# Patient Record
Sex: Male | Born: 1995 | Race: Black or African American | Hispanic: No | Marital: Single
Health system: Southern US, Community
[De-identification: ages and names within clinical notes are randomized; demographics above are authoritative.]

---

## 2019-05-06 ENCOUNTER — Emergency Department (HOSPITAL_COMMUNITY): Payer: Medicaid - Out of State

## 2019-05-06 ENCOUNTER — Emergency Department (HOSPITAL_COMMUNITY)
Admission: EM | Admit: 2019-05-06 | Discharge: 2019-05-06 | Disposition: A | Discharge status: Home / Self Care | Payer: Medicaid - Out of State | Attending: Emergency Medicine | Admitting: Emergency Medicine

## 2019-05-06 DIAGNOSIS — Y9302 Activity, running: Secondary | ICD-10-CM | POA: Diagnosis not present

## 2019-05-06 DIAGNOSIS — X500XXA Overexertion from strenuous movement or load, initial encounter: Secondary | ICD-10-CM | POA: Diagnosis not present

## 2019-05-06 DIAGNOSIS — Z113 Encounter for screening for infections with a predominantly sexual mode of transmission: Secondary | ICD-10-CM | POA: Insufficient documentation

## 2019-05-06 DIAGNOSIS — S99912A Unspecified injury of left ankle, initial encounter: Secondary | ICD-10-CM | POA: Diagnosis present

## 2019-05-06 DIAGNOSIS — S93402A Sprain of unspecified ligament of left ankle, initial encounter: Secondary | ICD-10-CM

## 2019-05-06 DIAGNOSIS — Y999 Unspecified external cause status: Secondary | ICD-10-CM | POA: Insufficient documentation

## 2019-05-06 DIAGNOSIS — N5089 Other specified disorders of the male genital organs: Secondary | ICD-10-CM | POA: Insufficient documentation

## 2019-05-06 DIAGNOSIS — Y9289 Other specified places as the place of occurrence of the external cause: Secondary | ICD-10-CM | POA: Insufficient documentation

## 2019-05-06 LAB — HIV ANTIBODY (ROUTINE TESTING W REFLEX): HIV Screen 4th Generation wRfx: NONREACTIVE

## 2019-05-06 NOTE — Discharge Instructions (Signed)
Please read and follow all provided instructions.  Your diagnoses today include:  1. Sprain of left ankle, unspecified ligament, initial encounter   2. Routine screening for STI (sexually transmitted infection)     Tests performed today include:  An x-ray of your ankle - does NOT show any broken bones  Vital signs. See below for your results today.   Medications prescribed:  Please use over-the-counter NSAID medications (ibuprofen, naproxen) as directed on the packaging for pain.   Take any prescribed medications only as directed.  Home care instructions:   Follow any educational materials contained in this packet  Follow R.I.C.E. Protocol:  R - rest your injury   I  - use ice on injury without applying directly to skin  C - compress injury with bandage or splint  E - elevate the injury as much as possible  Follow-up instructions: Please follow-up with your primary care provider or the provided orthopedic (bone specialist) if you continue to have significant pain or trouble walking in 1 week. In this case you may have a severe sprain that requires further care.   Return instructions:   Please return if your toes are numb or tingling, appear gray or blue, or you have severe pain (also elevate leg and loosen splint or wrap)  Please return to the Emergency Department if you experience worsening symptoms.   Please return if you have any other emergent concerns. -------------- Your caregiver has diagnosed you as suffering from an ankle sprain. Ankle sprain occurs when the ligaments that hold the ankle joint together are stretched or torn. It may take 4 to 6 weeks to heal.  For Activity: If prescribed crutches, use crutches with non-weight bearing for the first few days. Then, you may walk on your ankle as the pain allows, or as instructed. Start gradually with weight bearing on the affected ankle. Once you can walk pain free, then try jogging. When you can run forwards, then  you can try moving side-to-side. If you cannot walk without crutches in one week, you need a re-check. --------------  Other tests performed today include:  Test for gonorrhea and chlamydia.   Test for HIV and syphilis.   You will be notified by telephone with any positive results.   Vital signs. See below for your results today.   Home care instructions:  Read educational materials contained in this packet and follow any instructions provided.   You should tell your partners about your infection and avoid having sex for one week to allow time for the medicine to work.  Sexually transmitted disease testing also available at:   [facility], [facility]  [address] [place], phone [phone] or [phone]    Monday - Friday, call for an appointment  Return instructions:   Please return to the Emergency Department if you experience worsening symptoms.   Please return if you have any other emergent concerns.  Additional Information:  Your vital signs today were: BP 106/73 (BP Location: Left Arm)   Pulse (!) 120   Temp 98.3 F (36.8 C) (Oral)   Resp 16   SpO2 100%  If your blood pressure (BP) was elevated above 135/85 this visit, please have this repeated by your doctor within one month. --------------

## 2019-05-06 NOTE — ED Provider Notes (Signed)
[facility] EMERGENCY DEPARTMENT Provider Note   CSN: [id number] Arrival date & time: 05/06/19  1245     History Chief Complaint  Patient presents with  . Ankle Pain    Grant Cisneros is a 24 y.o. male.  Patient presents to the emergency department this morning with 2 problems.  First he twisted his left ankle while running down a ramp yesterday while playing laser tag.  He has had pain to the lateral aspect of his ankle and difficulty bearing weight since that time.  He is not able to bend his foot as much.  Pain is worse with weightbearing.  Patient placed a wrap and Velcro splint prior to arrival.  In addition, patient this morning noted a small lesion to the shaft of the penis.  This was painless.  No dysuria, urethral discharge, increased frequency or urgency.  No known contacts however patient is sexually active.  He was concerned about syphilis.  No history of herpes simplex.  No treatments prior to arrival.        No past medical history on file.  There are no problems to display for this patient.   The histories are not reviewed yet. Please review them in the "History" navigator section and refresh this [place].     No family history on file.  Social History   Tobacco Use  . Smoking status: Not on file  Substance Use Topics  . Alcohol use: Not on file  . Drug use: Not on file    Home Medications Prior to Admission medications   Not on File    Allergies    Patient has no known allergies.  Review of Systems   Review of Systems  Constitutional: Negative for activity change and fever.  HENT: Negative for sore throat.   Eyes: Negative for discharge.  Gastrointestinal: Negative for rectal pain.  Genitourinary: Positive for genital sores. Negative for discharge, dysuria, frequency, penile pain and testicular pain.  Musculoskeletal: Positive for arthralgias, gait problem and myalgias. Negative for back pain, joint swelling and neck  pain.  Skin: Negative for rash and wound.  Neurological: Negative for weakness and numbness.  Hematological: Negative for adenopathy.    Physical Exam Updated Vital Signs BP 106/73 (BP Location: Left Arm)   Pulse (!) 120   Temp 98.3 F (36.8 C) (Oral)   Resp 16   SpO2 100%   Physical Exam Vitals and nursing note reviewed.  Constitutional:      Appearance: He is well-developed.  HENT:     Head: Normocephalic and atraumatic.  Eyes:     Conjunctiva/sclera: Conjunctivae normal.  Cardiovascular:     Pulses:          Dorsalis pedis pulses are 2+ on the right side and 2+ on the left side.       Posterior tibial pulses are 2+ on the right side and 2+ on the left side.  Pulmonary:     Effort: No respiratory distress.  Genitourinary:    Penis: Lesions present.      Testes:        Right: Swelling not present.        Left: Swelling not present.    Musculoskeletal:        General: Tenderness present.     Cervical back: Normal range of motion and neck supple.     Left knee: Normal. Normal range of motion.     Right ankle: No tenderness. No lateral malleolus or medial malleolus tenderness.  Normal range of motion.     Left ankle: Tenderness present over the lateral malleolus. No medial malleolus, base of 5th metatarsal or proximal fibula tenderness. Decreased range of motion.     Left Achilles Tendon: Normal.     Left foot: Normal.  Skin:    General: Skin is warm and dry.  Neurological:     Mental Status: He is alert.     Comments: Distal motor, sensation, and vascular intact.     ED Results / Procedures / Treatments   Labs (all labs ordered are listed, but only abnormal results are displayed) Labs Reviewed - No data to display  EKG None  Radiology No results found.  Procedures Procedures (including critical care time)  Medications Ordered in ED Medications - No data to display  ED Course  I have reviewed the triage vital signs and the nursing notes.  Pertinent  labs & imaging results that were available during my care of the patient were reviewed by me and considered in my medical decision making (see chart for details).  Patient seen and examined.  Will obtain x-ray imaging of the ankle, off crutches.  Will test for STI including RPR and HIV per patient request.  Vital signs reviewed and are as follows: BP 106/73 (BP Location: Left Arm)   Pulse (!) 120   Temp 98.3 F (36.8 C) (Oral)   Resp 16   SpO2 100%   X-ray: Neg.  Plan: Crutches, RICE, NSAIDs, ROM exercises.  Follow-up: 1 week with PCP if not improving.     UA and HIV/RPR sent. No treatment today. Instructed patient on mychart follow-up. Encouraged follow-up if worsening.   MDM Rules/Calculators/A&P                      L LE neurovascularly intact. Pt with small penile lesion of uncertain etiology. Less likely HSV given no pain. Area is very small for syphilitic chancre. Although, patient first noticed this morning. Possible external trauma as well. Testing and follow-up as above.  Final Clinical Impression(s) / ED Diagnoses Final diagnoses:  Sprain of left ankle, unspecified ligament, initial encounter  Routine screening for STI (sexually transmitted infection)    Rx / DC Orders ED Discharge Orders    None       Renne Crigler, PA-C 05/06/19 1015    Rolan Bucco, MD 05/06/19 1235

## 2019-05-06 NOTE — ED Triage Notes (Signed)
Pt reports playing laser tag last night and lost his footing going down a ramp and twisted his left ankle. Pt states it was swollen so he went and got a wrap and a brace which did help some with pain but has been unable to bear weight due to pain.

## 2019-05-06 NOTE — ED Notes (Signed)
Pts heart rate a little elevated on arrival pt parked near UC and hopped down to ED on one foot.

## 2019-05-06 NOTE — ED Notes (Signed)
Patient verbalizes understanding of discharge instructions . Opportunity for questions and answers were provided . Armband removed by staff ,Pt discharged from ED. W/C  offered at D/C  and Declined W/C at D/C and was escorted to lobby by RN.  

## 2019-05-06 NOTE — Progress Notes (Signed)
Orthopedic Tech Progress Note Patient Details:  Grant Cisneros 1995/05/09 [id number]  Ortho Devices Type of Ortho Device: ASO, Crutches Ortho Device/Splint Location: left Ortho Device/Splint Interventions: Application   Post Interventions Patient Tolerated: Well Instructions Provided: Care of device   [name] 05/06/2019, 10:24 AM

## 2019-05-07 LAB — GC/CHLAMYDIA PROBE AMP (~~LOC~~) NOT AT ARMC
Chlamydia: NEGATIVE
Neisseria Gonorrhea: NEGATIVE

## 2019-05-07 LAB — RPR: RPR Ser Ql: NONREACTIVE

## 2019-06-23 ENCOUNTER — Emergency Department (HOSPITAL_COMMUNITY)
Admission: EM | Admit: 2019-06-23 | Discharge: 2019-06-23 | Disposition: A | Discharge status: Home / Self Care | Payer: PRIVATE HEALTH INSURANCE | Attending: Emergency Medicine | Admitting: Emergency Medicine

## 2019-06-23 ENCOUNTER — Emergency Department (HOSPITAL_COMMUNITY): Payer: PRIVATE HEALTH INSURANCE

## 2019-06-23 ENCOUNTER — Other Ambulatory Visit: Payer: Self-pay

## 2019-06-23 ENCOUNTER — Encounter (HOSPITAL_COMMUNITY): Payer: Self-pay

## 2019-06-23 DIAGNOSIS — M79605 Pain in left leg: Secondary | ICD-10-CM | POA: Diagnosis present

## 2019-06-23 MED ORDER — KETOROLAC TROMETHAMINE 15 MG/ML IJ SOLN: 15.0000 mg | Freq: Once | INTRAMUSCULAR | Status: DC

## 2019-06-23 MED ORDER — NAPROXEN 250 MG PO TABS
500.0000 mg | ORAL_TABLET | Freq: Once | ORAL | Status: AC
  Administered 2019-06-23: 500 mg via ORAL
  Filled 2019-06-23: qty 2

## 2019-06-23 MED ORDER — NAPROXEN 375 MG PO TABS: 375.0000 mg | ORAL_TABLET | Freq: Two times a day (BID) | ORAL | 0 refills | Status: AC

## 2019-06-23 NOTE — Discharge Instructions (Signed)
I have prescribed a short course of anti-inflammatories to help with your pain.  Please take 1 tablet twice a day for the next 7 days.  Please also attempt to apply ice to the area to help with your symptoms.

## 2019-06-23 NOTE — ED Triage Notes (Signed)
Onset 2 days ago pt was skating, fell on left side.  Next morning pt was having burning/tingling pain from left knee to ankle.  Today left leg felt ok.  Pt went to shoot basketball and when he reached up to shoot ball felt pain in back and pain down left leg.

## 2019-06-23 NOTE — ED Provider Notes (Signed)
[facility] EMERGENCY DEPARTMENT Provider Note   CSN: [id number] Arrival date & time: 06/23/19  1727     History Chief Complaint  Patient presents with  . Back Pain    Grant Cisneros is a 24 y.o. male.  24 y.o male with no PMH presents to the ED with a chief complaint of left sided back pain s/p fall x 2 days ago. Patient reports he was roller skatting when he fell landing on the left side of his body. Reports feeling pain the neck day along his back, however did not take any medication for improvement in symptoms. He states today when trying to jump to shoot a hop he felt sharp pain to the left side of his back with radiation to the lateral aspect, worst with ambulation. No LOC, no weakness, no saddle anesthesia, ambulatory in the ED.   The history is provided by the patient.       History reviewed. No pertinent past medical history.  There are no problems to display for this patient.    History reviewed. No pertinent family history.  Social History   Tobacco Use  . Smoking status: Not on file  Substance Use Topics  . Alcohol use: Not on file  . Drug use: Not on file    Home Medications Prior to Admission medications   Medication Sig Start Date End Date Taking? Authorizing Provider  naproxen (NAPROSYN) 375 MG tablet Take 1 tablet (375 mg total) by mouth 2 (two) times daily for 7 days. 06/23/19 06/30/19  Janeece Fitting, PA-C    Allergies    Patient has no known allergies.  Review of Systems   Review of Systems  Constitutional: Negative for fever.  Musculoskeletal: Positive for back pain and myalgias.  Neurological: Negative for light-headedness and headaches.    Physical Exam Updated Vital Signs BP 125/77 (BP Location: Right Arm)   Pulse 96   Temp 99.3 F (37.4 C) (Oral)   Resp 20   Ht 6' (1.829 m)   Wt 68.9 kg   SpO2 100%   BMI 20.61 kg/m   Physical Exam Vitals and nursing note reviewed.  Constitutional:      Appearance: Normal  appearance.  HENT:     Head: Normocephalic and atraumatic.     Nose: Nose normal.     Mouth/Throat:     Mouth: Mucous membranes are moist.  Cardiovascular:     Rate and Rhythm: Normal rate.     Pulses:          Dorsalis pedis pulses are 2+ on the right side and 2+ on the left side.       Posterior tibial pulses are 2+ on the right side and 2+ on the left side.  Pulmonary:     Effort: Pulmonary effort is normal.  Abdominal:     General: Abdomen is flat.  Musculoskeletal:     Cervical back: Normal range of motion and neck supple.       Legs:  Skin:    General: Skin is warm and dry.  Neurological:     Mental Status: He is alert and oriented to person, place, and time.     Motor: Motor function is intact.     Comments: RLE- KF,KE 5/5 strength LLE- HF, HE 5/5 strength Normal gait. No pronator drift. No leg drop.  CN I, II and VIII not tested. CN II-XII grossly intact bilaterally.        ED Results / Procedures / Treatments  Labs (all labs ordered are listed, but only abnormal results are displayed) Labs Reviewed - No data to display  EKG None  Radiology DG Hip Unilat W or Wo Pelvis 2-3 Views Left  Result Date: 06/23/2019 CLINICAL DATA:  Fall onto left side 2 days ago. EXAM: DG HIP (WITH OR WITHOUT PELVIS) 2-3V LEFT COMPARISON:  None. FINDINGS: The cortical margins of the bony pelvis and left hip are intact. No fracture. Pubic symphysis and sacroiliac joints are congruent. Pubic rami are intact. Both femoral heads are well-seated in the respective acetabula. IMPRESSION: Negative radiographs of the pelvis and left hip. Electronically Signed   By: Narda Rutherford M.D.   On: 06/23/2019 19:58    Procedures Procedures (including critical care time)  Medications Ordered in ED Medications  naproxen (NAPROSYN) tablet 500 mg (500 mg Oral Given 06/23/19 1955)    ED Course  I have reviewed the triage vital signs and the nursing notes.  Pertinent labs & imaging results that  were available during my care of the patient were reviewed by me and considered in my medical decision making (see chart for details).    MDM Rules/Calculators/A&P   Patient with no pertinent past medical history presents to the ED with complaints of left lower back pain and left leg pain.  Reports he was involved rollerblading accident 2 days ago, expresses symptoms as tingling sensation from his left back onto his left leg.  Pain is exacerbated with ambulation.  Patient attempted to do some hoops today when he felt the pain pulling at his back along with the lateral aspect of his left leg.  No red flags, did not lose consciousness during the incident, is ambulatory with a steady gait in the ED.  Has not taken any medication for improvement in his symptoms.  Vitals are within normal limits, we discussed x-ray imaging.  X-ray of his left hip and pelvis without any pathology.  Patient received naproxen while in the ED.  We discussed rice therapy along with anti-inflammatories at home.  Patient with recent management, return precautions constantly.   Portions of this note were generated with Scientist, clinical (histocompatibility and immunogenetics). Dictation errors may occur despite best attempts at proofreading.  Final Clinical Impression(s) / ED Diagnoses Final diagnoses:  Left leg pain    Rx / DC Orders ED Discharge Orders         Ordered    naproxen (NAPROSYN) 375 MG tablet  2 times daily     06/23/19 1900           Claude Manges, Cordelia Poche 06/23/19 2001    Raeford Razor, MD 06/23/19 2120

## 2019-06-23 NOTE — ED Notes (Signed)
Pt to XR

## 2019-06-23 NOTE — ED Notes (Signed)
Pt verbalized understanding of d/c instructions, follow up care, prescriptions and s/s requiring return to ed. Pt had no additional questions at this time.

## 2021-06-10 IMAGING — DX DG HIP (WITH OR WITHOUT PELVIS) 2-3V*L*
3 series · 3 of 3 positions shown · non-contrast
Comparison: None.

CLINICAL DATA: Fall onto left side 2 days ago.

EXAM:
DG HIP (WITH OR WITHOUT PELVIS) 2-3V LEFT

[pelvis ap]
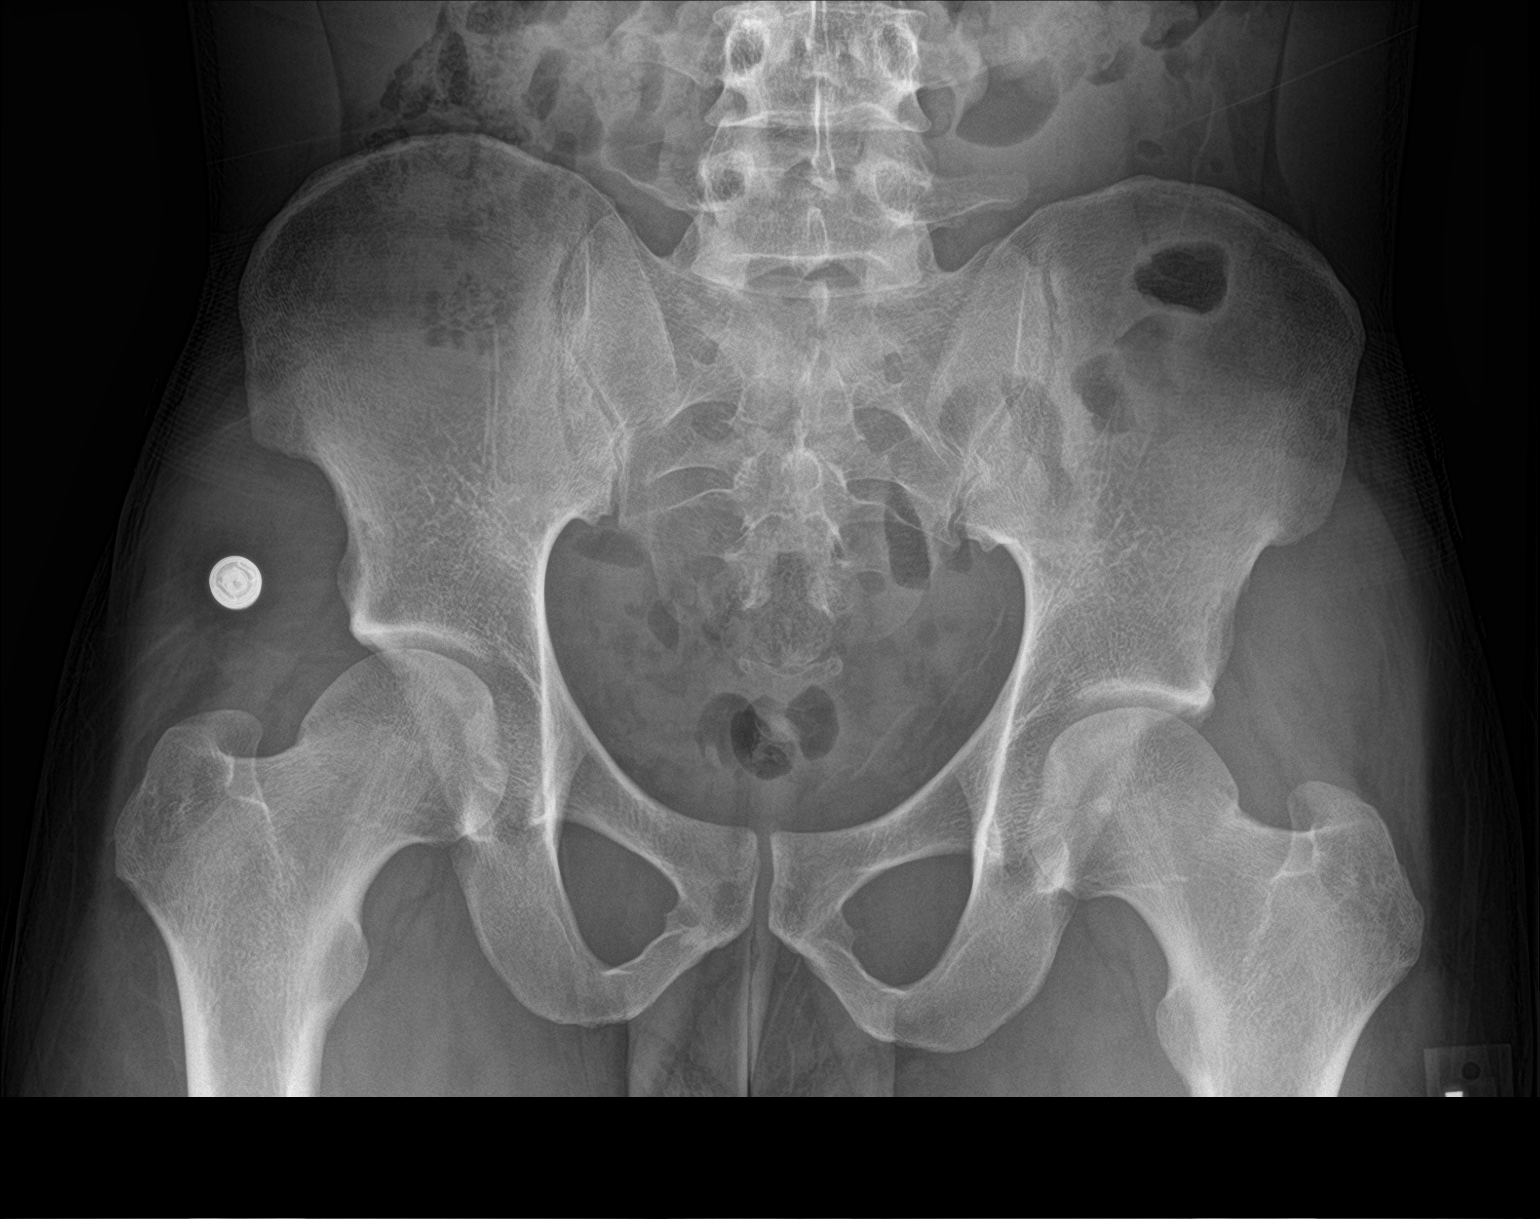

[hip ap]
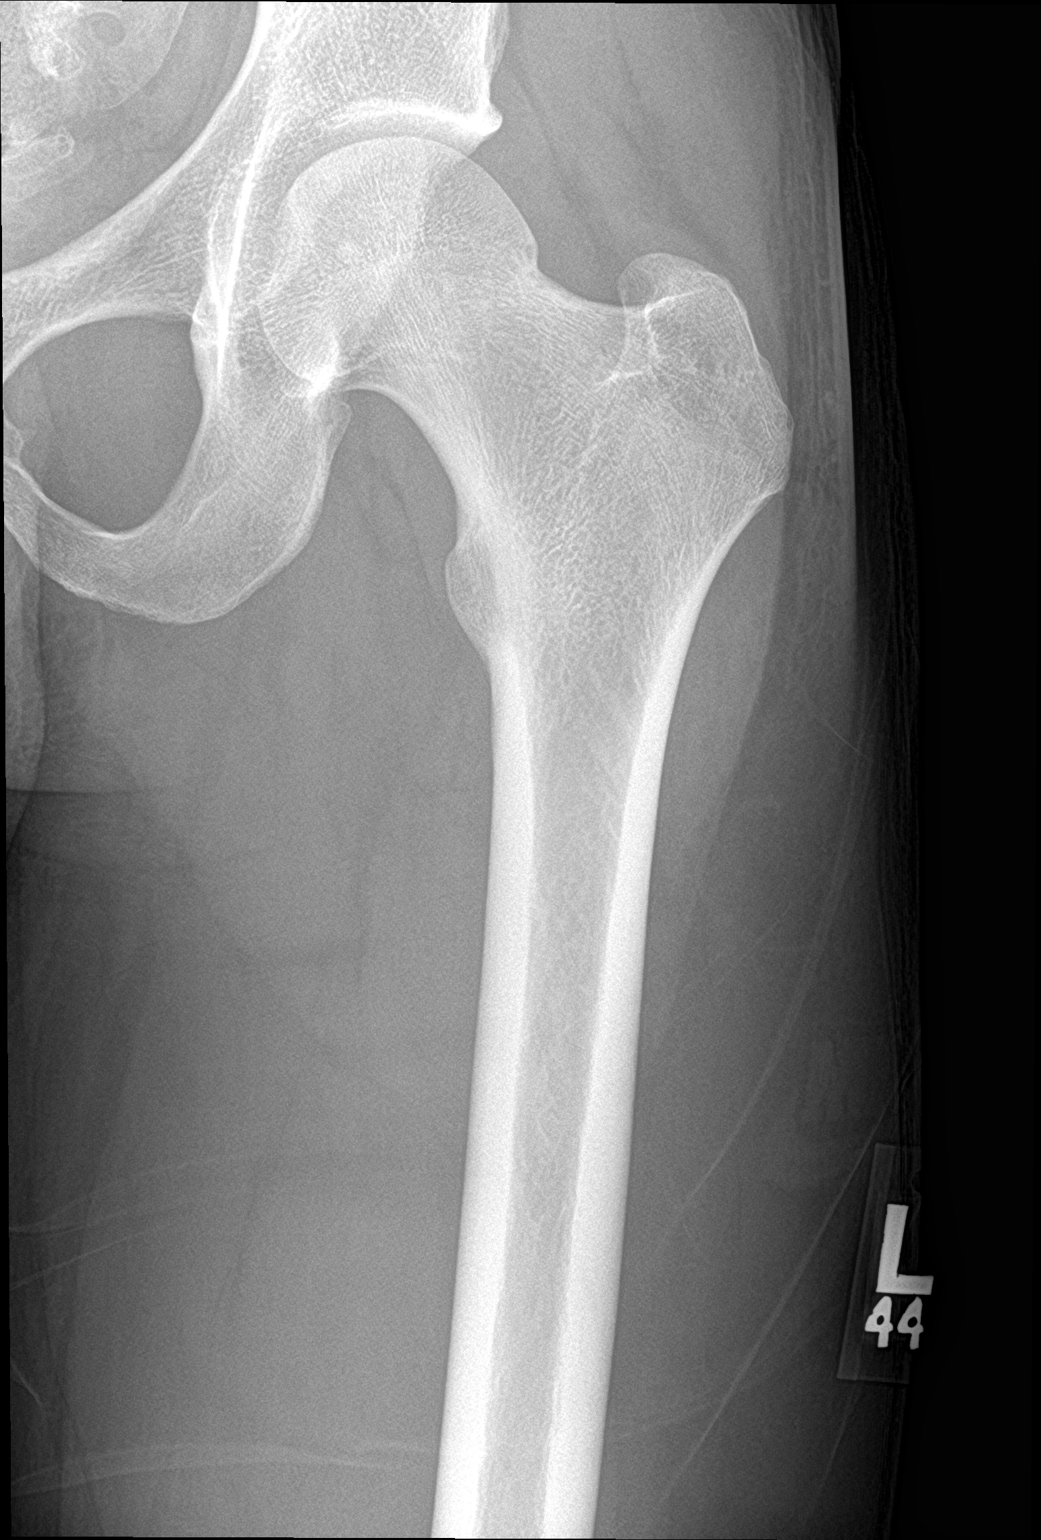

[hip lat]
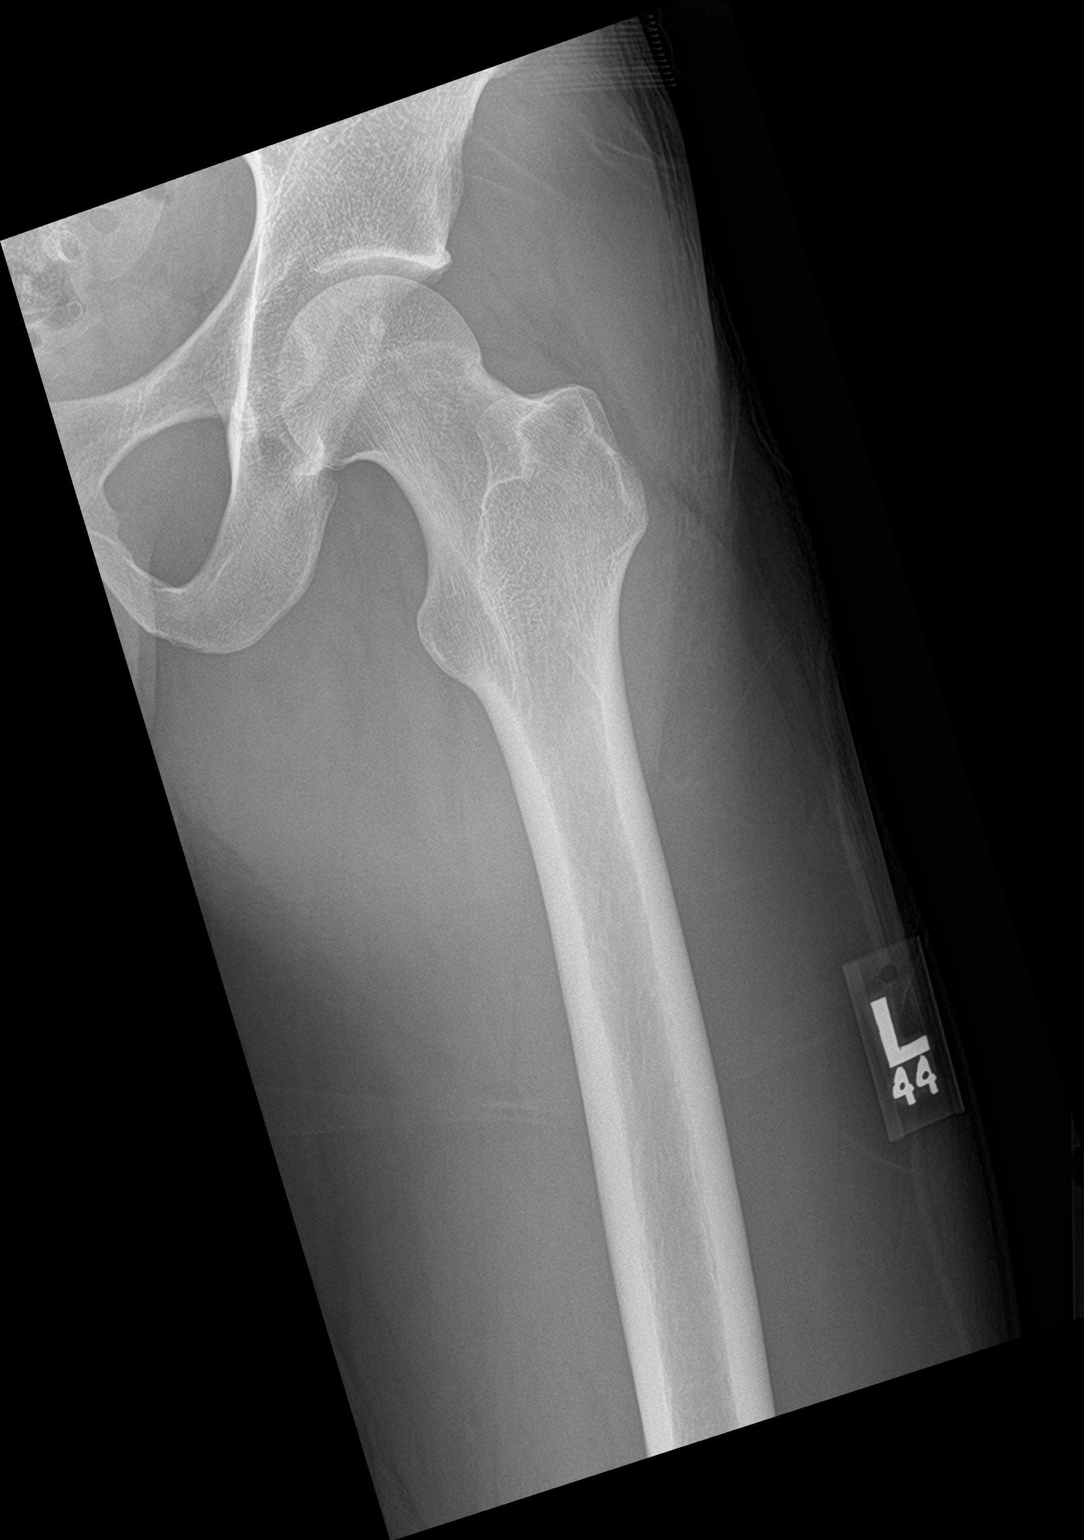

[3 of 3 positions shown; findings below may reference images not displayed]

FINDINGS: The cortical margins of the bony pelvis and left hip are intact. No
fracture. Pubic symphysis and sacroiliac joints are congruent. Pubic
rami are intact. Both femoral heads are well-seated in the
respective acetabula.
IMPRESSION: Negative radiographs of the pelvis and left hip.
